# Patient Record
Sex: Female | Born: 1950 | Race: White | Hispanic: No | Smoking: Former smoker
Health system: Southern US, Community
[De-identification: ages and names within clinical notes are randomized; demographics above are authoritative.]

## PROBLEM LIST (undated history)

## (undated) DIAGNOSIS — K5792 Diverticulitis of intestine, part unspecified, without perforation or abscess without bleeding: Secondary | ICD-10-CM

## (undated) DIAGNOSIS — R9230 Dense breasts, unspecified: Secondary | ICD-10-CM

## (undated) DIAGNOSIS — R922 Inconclusive mammogram: Secondary | ICD-10-CM

## (undated) DIAGNOSIS — I10 Essential (primary) hypertension: Secondary | ICD-10-CM

## (undated) DIAGNOSIS — R87619 Unspecified abnormal cytological findings in specimens from cervix uteri: Secondary | ICD-10-CM

## (undated) DIAGNOSIS — C801 Malignant (primary) neoplasm, unspecified: Secondary | ICD-10-CM

## (undated) DIAGNOSIS — A64 Unspecified sexually transmitted disease: Secondary | ICD-10-CM

## (undated) DIAGNOSIS — IMO0002 Reserved for concepts with insufficient information to code with codable children: Secondary | ICD-10-CM

## (undated) HISTORY — DX: Diverticulitis of intestine, part unspecified, without perforation or abscess without bleeding: K57.92

## (undated) HISTORY — DX: Inconclusive mammogram: R92.2

## (undated) HISTORY — DX: Unspecified sexually transmitted disease: A64

## (undated) HISTORY — DX: Reserved for concepts with insufficient information to code with codable children: IMO0002

## (undated) HISTORY — DX: Unspecified abnormal cytological findings in specimens from cervix uteri: R87.619

## (undated) HISTORY — PX: TEMPOROMANDIBULAR JOINT SURGERY: SHX35

## (undated) HISTORY — DX: Malignant (primary) neoplasm, unspecified: C80.1

## (undated) HISTORY — PX: TONSILLECTOMY: SUR1361

## (undated) HISTORY — DX: Dense breasts, unspecified: R92.30

## (undated) HISTORY — PX: CHOLECYSTECTOMY: SHX55

## (undated) HISTORY — PX: APPENDECTOMY: SHX54

---

## 1981-05-04 HISTORY — PX: VAGINAL HYSTERECTOMY: SUR661

## 2009-01-18 ENCOUNTER — Encounter: Admission: RE | Admit: 2009-01-18 | Discharge: 2009-01-18 | Payer: Self-pay | Admitting: Chiropractic Medicine

## 2010-01-02 HISTORY — PX: COLPOSCOPY: SHX161

## 2011-10-23 ENCOUNTER — Other Ambulatory Visit: Payer: Self-pay | Admitting: Sports Medicine

## 2011-10-23 DIAGNOSIS — M542 Cervicalgia: Secondary | ICD-10-CM

## 2011-10-26 ENCOUNTER — Ambulatory Visit
Admission: RE | Admit: 2011-10-26 | Discharge: 2011-10-26 | Disposition: A | Discharge status: Home / Self Care | Payer: BC Managed Care – PPO | Source: Ambulatory Visit | Attending: Sports Medicine | Admitting: Sports Medicine

## 2011-10-26 DIAGNOSIS — M542 Cervicalgia: Secondary | ICD-10-CM

## 2012-03-19 ENCOUNTER — Encounter (HOSPITAL_COMMUNITY): Payer: Self-pay | Admitting: Emergency Medicine

## 2012-03-19 ENCOUNTER — Emergency Department (HOSPITAL_COMMUNITY)
Admission: EM | Admit: 2012-03-19 | Discharge: 2012-03-19 | Disposition: A | Discharge status: Home / Self Care | Payer: BC Managed Care – PPO | Attending: Emergency Medicine | Admitting: Emergency Medicine

## 2012-03-19 DIAGNOSIS — Z79899 Other long term (current) drug therapy: Secondary | ICD-10-CM | POA: Insufficient documentation

## 2012-03-19 DIAGNOSIS — I10 Essential (primary) hypertension: Secondary | ICD-10-CM | POA: Insufficient documentation

## 2012-03-19 DIAGNOSIS — T7840XA Allergy, unspecified, initial encounter: Secondary | ICD-10-CM

## 2012-03-19 DIAGNOSIS — R21 Rash and other nonspecific skin eruption: Secondary | ICD-10-CM | POA: Insufficient documentation

## 2012-03-19 DIAGNOSIS — Y939 Activity, unspecified: Secondary | ICD-10-CM | POA: Insufficient documentation

## 2012-03-19 DIAGNOSIS — Y929 Unspecified place or not applicable: Secondary | ICD-10-CM | POA: Insufficient documentation

## 2012-03-19 HISTORY — DX: Essential (primary) hypertension: I10

## 2012-03-19 LAB — POCT I-STAT TROPONIN I: Troponin i, poc: 0.02 ng/mL (ref 0.00–0.08)

## 2012-03-19 MED ORDER — HYDROXYZINE HCL 10 MG PO TABS: 10.0000 mg | ORAL_TABLET | Freq: Three times a day (TID) | ORAL | Status: DC | PRN

## 2012-03-19 MED ORDER — SODIUM CHLORIDE 0.9 % IV SOLN
Freq: Once | INTRAVENOUS | Status: AC
  Administered 2012-03-19: 16:00:00 via INTRAVENOUS

## 2012-03-19 MED ORDER — EPINEPHRINE 0.3 MG/0.3ML IJ DEVI: 0.3000 mg | INTRAMUSCULAR | Status: AC | PRN

## 2012-03-19 MED ORDER — HYDROXYZINE HCL 10 MG PO TABS
10.0000 mg | ORAL_TABLET | Freq: Once | ORAL | Status: AC
  Administered 2012-03-19: 10 mg via ORAL
  Filled 2012-03-19: qty 1

## 2012-03-19 MED ORDER — METHYLPREDNISOLONE SODIUM SUCC 125 MG IJ SOLR
INTRAMUSCULAR | Status: AC
  Administered 2012-03-19: 125 mg
  Filled 2012-03-19: qty 2

## 2012-03-19 NOTE — ED Notes (Signed)
Pt states she feels much better. Pt given Ginger Ale for PO challenge. Pt awaiting disposition. Family at bedside.

## 2012-03-19 NOTE — ED Notes (Signed)
Pt in via EMS post bee sting. Pt believes that it was a "yellow jacket". Pt denies having a reaction before. Pt reports after sting she had difficulty breathing, emesis and hives all over. Pt took 50 benadryl PO prior to EMS arrival. Ems administered 50 Zantac, 4 Zofran. Pt now c/o upper epigastric pain that radiates to back, 9/10 pain. Pt in NAD and A&Ox4.

## 2012-03-19 NOTE — ED Notes (Signed)
WUJ:[id number]<JY> Expected date:03/19/12<BR> Expected time: 2:13 PM<BR> Means of arrival:Ambulance<BR> Comments:<BR> Bee Sting allergic reaction

## 2012-03-19 NOTE — ED Notes (Signed)
Pt reports she is feeling much better. Pt has raised,reddened rash over L and R arms, abdomen and back. Pt vs stable.

## 2012-03-19 NOTE — ED Notes (Signed)
Pt states itching has improved. Pt resting quietly. IV fluids infusing per order. Pt states she is still a bit nauseous. Pt with no acute distress. VSS. Family at bedside.

## 2012-03-19 NOTE — ED Provider Notes (Signed)
History     CSN: [id number]  Arrival date & time 03/19/12  1419   First MD Initiated Contact with Patient 03/19/12 1455      Chief Complaint  Patient presents with  . Allergic Reaction    (Consider location/radiation/quality/duration/timing/severity/associated sxs/prior treatment) Patient is a 61 y.o. female presenting with allergic reaction. The history is provided by the patient.  Allergic Reaction The primary symptoms are  abdominal pain (epigastric), nausea, rash (urticaria), angioedema and urticaria. The primary symptoms do not include wheezing, shortness of breath, cough, vomiting, diarrhea, dizziness, palpitations or altered mental status. The current episode started less than 1 hour ago. The problem has not changed since onset.This is a new problem.  The rash is associated with itching.   The angioedema began less than 1 hour ago. The angioedema has been unchanged since its onset. It is a new problem. It is located on the tongue. The angioedema is not associated with shortness of breath or stridor.   The onset of the reaction was associated with insect bite/sting. Significant symptoms also include itching. Significant symptoms that are not present include eye redness, flushing or rhinorrhea.    Past Medical History  Diagnosis Date  . Hypertension     Past Surgical History  Procedure Date  . Cholecystectomy   . Abdominal hysterectomy   . Tonsillectomy   . Appendectomy   . Temporomandibular joint surgery     Right    No family history on file.  History  Substance Use Topics  . Smoking status: Never Smoker   . Smokeless tobacco: Not on file  . Alcohol Use: 0.6 oz/week    1 Glasses of wine per week    OB History    Grav Para Term Preterm Abortions TAB SAB Ect Mult Living                  Review of Systems  Constitutional: Negative for fever, chills and appetite change.  HENT: Negative for congestion, sore throat, rhinorrhea, drooling, mouth sores and  trouble swallowing.   Eyes: Negative for redness and visual disturbance.  Respiratory: Negative for cough, shortness of breath, wheezing and stridor.   Cardiovascular: Negative for chest pain, palpitations and leg swelling.  Gastrointestinal: Positive for nausea and abdominal pain (epigastric). Negative for vomiting and diarrhea.  Genitourinary: Negative for dysuria, urgency and frequency.  Musculoskeletal: Positive for back pain.  Skin: Positive for itching and rash (urticaria). Negative for flushing.  Neurological: Negative for dizziness, syncope, weakness, light-headedness, numbness and headaches.  Psychiatric/Behavioral: Negative for confusion and altered mental status.    Allergies  Bee venom  Home Medications   Current Outpatient Rx  Name  Route  Sig  Dispense  Refill  . DIPHENHYDRAMINE HCL (SLEEP) 25 MG PO TABS   Oral   Take 50 mg by mouth once.         . TELMISARTAN 40 MG PO TABS   Oral   Take 40 mg by mouth daily.           BP 126/80  Pulse 54  Temp 97.4 F (36.3 C)  Resp 22  SpO2 100%  Physical Exam  Nursing note and vitals reviewed. Constitutional: She is oriented to person, place, and time. She appears well-developed and well-nourished. No distress.  HENT:  Head: Normocephalic and atraumatic.  Mouth/Throat: Oropharynx is clear and moist and mucous membranes are normal.       No sign of airway obstruction. No edema of face, eyelids, lips, uvula. Mild  tongue swelling. Uvula midline, no nasal congestion or drooling.  No trismus.  Neck: Trachea normal, normal range of motion and full passive range of motion without pain. Neck supple. Carotid bruit is not present. No tracheal deviation present.       No carotid bruits or stridor  Cardiovascular: Normal rate, regular rhythm, intact distal pulses and normal pulses.        Not tachycardic  Pulmonary/Chest: Effort normal. No stridor.  Musculoskeletal: Normal range of motion.  Neurological: She is alert and  oriented to person, place, and time.  Skin: Skin is warm and intact. Rash noted. Rash is urticarial. She is not diaphoretic.       Not diaphoretic. Raised erythematous welts, pruritic in nature, located all extremities & torso. Blanchable urticaria, no petechiae or purpura.   Psychiatric: She has a normal mood and affect. Her behavior is normal.    ED Course  Procedures (including critical care time)  Labs Reviewed - No data to display No results found.   No diagnosis found.  4:29 PM- pt re-evaluated without worsening of symptoms. Reports improved pruritis. Will re- check in 1 hour  5:48 PM No evidence of rebound reaction, pt cont to improve   MDM  Allergic rxn  Patient re-evaluated prior to dc, is hemodynamically stable, in no respiratory distress, and denies the feeling of throat closing. Pt has been advised to take OTC benadryl & return to the ED if they have a mod-severe allergic rxn (s/s including throat closing, difficulty breathing, swelling of lips face or tongue). Pt is to follow up with their PCP. Pt is agreeable with plan & verbalizes understanding.         Jaci Carrel, [place] 03/19/12 1749

## 2012-03-20 NOTE — ED Provider Notes (Signed)
Medical screening examination/treatment/procedure(s) were performed by non-physician practitioner and as supervising physician I was immediately available for consultation/collaboration.  Harriet Sutphen, MD 03/20/12 0000 

## 2012-04-08 ENCOUNTER — Other Ambulatory Visit: Payer: Self-pay | Admitting: Obstetrics & Gynecology

## 2012-04-08 DIAGNOSIS — Z803 Family history of malignant neoplasm of breast: Secondary | ICD-10-CM

## 2012-04-08 DIAGNOSIS — R922 Inconclusive mammogram: Secondary | ICD-10-CM

## 2012-05-03 ENCOUNTER — Other Ambulatory Visit: Payer: BC Managed Care – PPO

## 2013-01-04 ENCOUNTER — Encounter: Payer: Self-pay | Admitting: Certified Nurse Midwife

## 2013-01-09 ENCOUNTER — Ambulatory Visit: Payer: Self-pay | Admitting: Certified Nurse Midwife

## 2013-01-10 ENCOUNTER — Ambulatory Visit: Payer: Self-pay | Admitting: Certified Nurse Midwife

## 2013-01-19 ENCOUNTER — Encounter: Payer: Self-pay | Admitting: Certified Nurse Midwife

## 2013-01-23 ENCOUNTER — Ambulatory Visit (INDEPENDENT_AMBULATORY_CARE_PROVIDER_SITE_OTHER): Payer: BC Managed Care – PPO | Admitting: Certified Nurse Midwife

## 2013-01-23 ENCOUNTER — Encounter: Payer: Self-pay | Admitting: Certified Nurse Midwife

## 2013-01-23 VITALS — BP 112/62 | HR 64 | Resp 16 | Ht 66.25 in | Wt 155.0 lb

## 2013-01-23 DIAGNOSIS — Z9189 Other specified personal risk factors, not elsewhere classified: Secondary | ICD-10-CM

## 2013-01-23 DIAGNOSIS — Z0000 Encounter for general adult medical examination without abnormal findings: Secondary | ICD-10-CM

## 2013-01-23 DIAGNOSIS — Z01419 Encounter for gynecological examination (general) (routine) without abnormal findings: Secondary | ICD-10-CM

## 2013-01-23 NOTE — Progress Notes (Signed)
62 y.o. G0P0000 Single Caucasian Fe here for annual exam. Denies vaginal bleeding or vaginal dryness. Recently treated for anemia with PCP.  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 No health issues today. Sees PCP for aex, labs, medication management for hypertension.. No HSV outbreaks, no Rx needed.  Patient's last menstrual period was 05/04/1981.          Sexually active: yes  The current method of family planning is status post hysterectomy.    Exercising: yes  workout with a trainer Smoker:  no  Health Maintenance: Pap:  01-08-12 neg MMG:  12/13 normal Colonoscopy:  2010 BMD:   2010 TDaP:  2010 Labs: Hgb. 13.2 Self breast exam: done occ   reports that she has quit smoking. She does not have any smokeless tobacco history on file. She reports that she drinks about 4.8 ounces of alcohol per week. She reports that she does not use illicit drugs.  Past Medical History  Diagnosis Date  . Hypertension   . STD (sexually transmitted disease)     HSV2  . Abnormal Pap smear     sever dysplasia  . Dense breast     BSGI     Past Surgical History  Procedure Laterality Date  . Cholecystectomy    . Tonsillectomy    . Appendectomy    . Temporomandibular joint surgery      Right  . Colposcopy  9/11    atypia only  . Vaginal hysterectomy  1983    Current Outpatient Prescriptions  Medication Sig Dispense Refill  . EPINEPHrine (EPIPEN) 0.3 mg/0.3 mL DEVI Inject 0.3 mLs (0.3 mg total) into the muscle as needed.  1 Device  3  . Multiple Vitamins-Minerals (MULTIVITAMIN PO) Take by mouth. occ      . telmisartan (MICARDIS) 40 MG tablet Take 40 mg by mouth daily.      . valACYclovir (VALTREX) 1000 MG tablet Take 1,000 mg by mouth. Take 1 tablet twice a day for 3 days at onset of outbreak       No current facility-administered medications for this visit.    Family History  Problem Relation Age of Onset  . Hypertension Mother   . Lung disease Mother   . Heart  disease Mother   . Breast cancer Mother   . Osteoporosis Mother   . Hypertension Father   . Heart disease Father   . Hypertension Brother   . Cancer Brother   . Breast cancer Maternal Aunt   . Hypertension Brother     ROS:  Pertinent items are noted in HPI.  Otherwise, a comprehensive ROS was negative.  Exam:   BP 112/62  Pulse 64  Resp 16  Ht 5' 6.25" (1.683 m)  Wt 155 lb (70.308 kg)  BMI 24.82 kg/m2  LMP 05/04/1981 Height: 5' 6.25" (168.3 cm)  Ht Readings from Last 3 Encounters:  01/23/13 5' 6.25" (1.683 m)    General appearance: alert, cooperative and appears stated age Head: Normocephalic, without obvious abnormality, atraumatic Neck: no adenopathy, supple, symmetrical, trachea midline and thyroid normal to inspection and palpation Lungs: clear to auscultation bilaterally Breasts: normal appearance, no masses or tenderness, No nipple retraction or dimpling, No nipple discharge or bleeding, No axillary or supraclavicular adenopathy Heart: regular rate and rhythm Abdomen: soft, non-tender; no masses,  no organomegaly Extremities: extremities normal, atraumatic, no cyanosis or edema Skin: Skin color, texture, turgor normal. No rashes or lesions Lymph nodes: Cervical, supraclavicular, and  axillary nodes normal. No abnormal inguinal nodes palpated Neurologic: Grossly normal   Pelvic: External genitalia:  no lesions              Urethra:  normal appearing urethra with no masses, tenderness or lesions              Bartholin's and Skene's: normal                 Vagina: normal appearing vagina with normal color and discharge, no lesions              Cervix: absent              Pap taken: yes Bimanual Exam:  Uterus:  uterus absent              Adnexa: normal adnexa and no mass, fullness, tenderness               Rectovaginal: Confirms               Anus:  normal sphincter tone, no lesions  A:  Well Woman with normal exam  Menopausal no HRT S/P TVH, 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 Will ? Do MRI this year(insurance change)  DES exposure in utero  Hypertension with PCP management  Anemia resolved   P:   Reviewed health and wellness pertinent to exam  Stressed aex for evaluation and breast exam  Continue PCP follow up as indicated  Pap smear as per guidelines   Mammogram yearly with MRI this year if financially possible pap smear taken with HPVHR  counseled on breast self exam, mammography screening, adequate intake of calcium and vitamin D, diet and exercise, Kegel's exercises  return annually or prn  An After Visit Summary was printed and given to the patient.

## 2013-01-23 NOTE — Patient Instructions (Signed)

## 2013-01-24 NOTE — Progress Notes (Signed)
Note reviewed, agree with plan.  Anvika Gashi, MD  

## 2013-12-15 ENCOUNTER — Telehealth: Payer: Self-pay | Admitting: Certified Nurse Midwife

## 2013-12-15 ENCOUNTER — Other Ambulatory Visit: Payer: Self-pay | Admitting: Certified Nurse Midwife

## 2013-12-15 MED ORDER — VALACYCLOVIR HCL 1 G PO TABS: ORAL_TABLET | ORAL | Status: DC

## 2013-12-15 NOTE — Telephone Encounter (Signed)
Pt wanting a refill on her valacyclovir 1mg .

## 2013-12-15 NOTE — Telephone Encounter (Signed)
Ok to refill 

## 2013-12-15 NOTE — Telephone Encounter (Signed)
Rx sent to pharmacy. - LM for pt re: Rx sent to pharmacy.

## 2013-12-15 NOTE — Telephone Encounter (Signed)
Last AEX 01/23/13 Next appt 01/24/14  Please advise.

## 2014-01-24 ENCOUNTER — Encounter: Payer: Self-pay | Admitting: Certified Nurse Midwife

## 2014-01-24 ENCOUNTER — Ambulatory Visit (INDEPENDENT_AMBULATORY_CARE_PROVIDER_SITE_OTHER): Payer: BC Managed Care – PPO | Admitting: Certified Nurse Midwife

## 2014-01-24 VITALS — BP 120/80 | HR 72 | Resp 16 | Ht 66.5 in | Wt 159.0 lb

## 2014-01-24 DIAGNOSIS — Z01419 Encounter for gynecological examination (general) (routine) without abnormal findings: Secondary | ICD-10-CM

## 2014-01-24 NOTE — Progress Notes (Signed)
63 y.o. G0P0000 Single Caucasian Fe here for annual exam. Menopausal no HRT. Denies vaginal bleeding. Treating with vaginal dryness Olive Oil with good results.. Recent visit with PCP with labs and aex, all normal. Exercising routinely. 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 Planning marriage sometime in the next year. No HSV outbreaks in past year. Patient planning to try for MRI after next mammogram, could not afford in past with minimal insurance coverage. No other health issues today.  Patient's last menstrual period was 05/04/1981.          Sexually active: Yes.    The current method of family planning is status post hysterectomy.    Exercising: Yes.    workout with a trainer twice weekly  Smoker:  no  Health Maintenance: Pap: 01-23-13 neg HPV HR neg MMG: 05-03-13 composition category d, bi-rads category 1:neg Colonoscopy: 01-23-14 negative BMD:   2010 TDaP:  2010 Labs: pcp Self breast exam: done occ   reports that she has quit smoking. She does not have any smokeless tobacco history on file. She reports that she drinks about 4.8 - 7.2 ounces of alcohol per week. She reports that she does not use illicit drugs.  Past Medical History  Diagnosis Date  . Hypertension   . STD (sexually transmitted disease)     HSV2  . Abnormal Pap smear     sever dysplasia  . Dense breast     BSGI     Past Surgical History  Procedure Laterality Date  . Cholecystectomy    . Tonsillectomy    . Appendectomy    . Temporomandibular joint surgery      Right  . Colposcopy  9/11    atypia only  . Vaginal hysterectomy  1983    Current Outpatient Prescriptions  Medication Sig Dispense Refill  . Cholecalciferol (VITAMIN D PO) Take by mouth daily.      Marland Kitchen EPINEPHrine (EPIPEN) 0.3 mg/0.3 mL DEVI Inject 0.3 mLs (0.3 mg total) into the muscle as needed.  1 Device  3  . Multiple Vitamins-Minerals (MULTIVITAMIN PO) Take by mouth. occ      . Probiotic Product (ALIGN PO) Take by mouth daily.      Marland Kitchen  telmisartan (MICARDIS) 40 MG tablet Take 40 mg by mouth daily.      . valACYclovir (VALTREX) 1000 MG tablet Take 1 tablet twice a day for 3 days at onset of outbreak  30 tablet  1   No current facility-administered medications for this visit.    Family History  Problem Relation Age of Onset  . Hypertension Mother   . Lung disease Mother   . Heart disease Mother   . Breast cancer Mother   . Osteoporosis Mother   . Hypertension Father   . Heart disease Father   . Hypertension Brother   . Cancer Brother     esophageal  . Breast cancer Maternal Aunt   . Hypertension Brother     ROS:  Pertinent items are noted in HPI.  Otherwise, a comprehensive ROS was negative.  Exam:   BP 120/80  Pulse 72  Resp 16  Ht 5' 6.5" (1.689 m)  Wt 159 lb (72.122 kg)  BMI 25.28 kg/m2  LMP 05/04/1981 Height: 5' 6.5" (168.9 cm)  Ht Readings from Last 3 Encounters:  01/24/14 5' 6.5" (1.689 m)  01/23/13 5' 6.25" (1.683 m)    General appearance: alert, cooperative and appears stated age Head: Normocephalic, without obvious abnormality, atraumatic Neck: no adenopathy,  supple, symmetrical, trachea midline and thyroid normal to inspection and palpation Lungs: clear to auscultation bilaterally Breasts: normal appearance, no masses or tenderness, No nipple retraction or dimpling, No nipple discharge or bleeding, No axillary or supraclavicular adenopathy Heart: regular rate and rhythm Abdomen: soft, non-tender; no masses,  no organomegaly Extremities: extremities normal, atraumatic, no cyanosis or edema Skin: Skin color, texture, turgor normal. No rashes or lesions Lymph nodes: Cervical, supraclavicular, and axillary nodes normal. No abnormal inguinal nodes palpated Neurologic: Grossly normal   Pelvic: External genitalia:  no lesions              Urethra:  normal appearing urethra with no masses, tenderness or lesions              Bartholin's and Skene's: normal                 Vagina: normal  appearing vagina with normal color and discharge, no lesions              Cervix: absent              Pap taken: No. Bimanual Exam:  Uterus:  uterus absent              Adnexa: normal adnexa and no mass, fullness, tenderness               Rectovaginal: Confirms               Anus:  normal sphincter tone, no lesions  A:  Well Woman with normal exam  Menopausal no HRT S/P TAH for severe dysplasia 1983  LSIL with atypia only in 2011, all paps negative  Breast class D density, Family history of breast cancer, mother and maternal aunt  HSV history has RX  P:   Reviewed health and wellness pertinent to exam  Discussed pap frequency and the time of routine paps after surgery as 20 years and vaginal pap change with normal paps and negative HPVHR and recommendation.  Pap smear  not taken today will need next year.  Discussed Class D density and need for 3 D mammogram and as she is aware and plans to do MRI. Patient aware MRI has to be done within 3 months of mammogram. Patient to have mammogram 12/15 and will called when completed for MRI to be scheduled if mammogram normal.   counseled on breast self exam, mammography screening, adequate intake of calcium and vitamin D, diet and exercise, Kegel's exercises  return annually or prn  An After Visit Summary was printed and given to the patient.

## 2014-01-24 NOTE — Patient Instructions (Signed)

## 2014-01-25 NOTE — Progress Notes (Signed)
Reviewed personally.  M. Suzanne Carly Applegate, MD.  

## 2015-01-30 ENCOUNTER — Encounter: Payer: Self-pay | Admitting: Certified Nurse Midwife

## 2015-01-30 ENCOUNTER — Ambulatory Visit (INDEPENDENT_AMBULATORY_CARE_PROVIDER_SITE_OTHER): Payer: BLUE CROSS/BLUE SHIELD | Admitting: Certified Nurse Midwife

## 2015-01-30 VITALS — BP 118/74 | HR 70 | Resp 16 | Ht 66.5 in | Wt 154.0 lb

## 2015-01-30 DIAGNOSIS — Z01419 Encounter for gynecological examination (general) (routine) without abnormal findings: Secondary | ICD-10-CM

## 2015-01-30 DIAGNOSIS — Z124 Encounter for screening for malignant neoplasm of cervix: Secondary | ICD-10-CM | POA: Diagnosis not present

## 2015-01-30 DIAGNOSIS — N951 Menopausal and female climacteric states: Secondary | ICD-10-CM | POA: Diagnosis not present

## 2015-01-30 DIAGNOSIS — K649 Unspecified hemorrhoids: Secondary | ICD-10-CM

## 2015-01-30 NOTE — Progress Notes (Signed)
Encounter reviewed Jill Jertson, MD   

## 2015-01-30 NOTE — Patient Instructions (Signed)
EXERCISE AND DIET:  We recommended that you start or continue a regular exercise program for good health. Regular exercise means any activity that makes your heart beat faster and makes you sweat.  We recommend exercising at least 30 minutes per day at least 3 days a week, preferably 4 or 5.  We also recommend a diet low in fat and sugar.  Inactivity, poor dietary choices and obesity can cause diabetes, heart attack, stroke, and kidney damage, among others.    ALCOHOL AND SMOKING:  Women should limit their alcohol intake to no more than 7 drinks/beers/glasses of wine (combined, not each!) per week. Moderation of alcohol intake to this level decreases your risk of breast cancer and liver damage. And of course, no recreational drugs are part of a healthy lifestyle.  And absolutely no smoking or even second hand smoke. Most people know smoking can cause heart and lung diseases, but did you know it also contributes to weakening of your bones? Aging of your skin?  Yellowing of your teeth and nails?  CALCIUM AND VITAMIN D:  Adequate intake of calcium and Vitamin D are recommended.  The recommendations for exact amounts of these supplements seem to change often, but generally speaking 600 mg of calcium (either carbonate or citrate) and 800 units of Vitamin D per day seems prudent. Certain women may benefit from higher intake of Vitamin D.  If you are among these women, your doctor will have told you during your visit.    PAP SMEARS:  Pap smears, to check for cervical cancer or precancers,  have traditionally been done yearly, although recent scientific advances have shown that most women can have pap smears less often.  However, every woman still should have a physical exam from her gynecologist every year. It will include a breast check, inspection of the vulva and vagina to check for abnormal growths or skin changes, a visual exam of the cervix, and then an exam to evaluate the size and shape of the uterus and  ovaries.  And after 64 years of age, a rectal exam is indicated to check for rectal cancers. We will also provide age appropriate advice regarding health maintenance, like when you should have certain vaccines, screening for sexually transmitted diseases, bone density testing, colonoscopy, mammograms, etc.   MAMMOGRAMS:  All women over 40 years old should have a yearly mammogram. Many facilities now offer a "3D" mammogram, which may cost around $50 extra out of pocket. If possible,  we recommend you accept the option to have the 3D mammogram performed.  It both reduces the number of women who will be called back for extra views which then turn out to be normal, and it is better than the routine mammogram at detecting truly abnormal areas.    COLONOSCOPY:  Colonoscopy to screen for colon cancer is recommended for all women at age 50.  We know, you hate the idea of the prep.  We agree, BUT, having colon cancer and not knowing it is worse!!  Colon cancer so often starts as a polyp that can be seen and removed at colonscopy, which can quite literally save your life!  And if your first colonoscopy is normal and you have no family history of colon cancer, most women don't have to have it again for 10 years.  Once every ten years, you can do something that may end up saving your life, right?  We will be happy to help you get it scheduled when you are ready.    Be sure to check your insurance coverage so you understand how much it will cost.  It may be covered as a preventative service at no cost, but you should check your particular policy.     Hemorrhoids Hemorrhoids are swollen veins around the rectum or anus. There are two types of hemorrhoids:   Internal hemorrhoids. These occur in the veins just inside the rectum. They may poke through to the outside and become irritated and painful.  External hemorrhoids. These occur in the veins outside the anus and can be felt as a painful swelling or hard lump near the  anus. CAUSES  Pregnancy.   Obesity.   Constipation or diarrhea.   Straining to have a bowel movement.   Sitting for long periods on the toilet.  Heavy lifting or other activity that caused you to strain.  Anal intercourse. SYMPTOMS   Pain.   Anal itching or irritation.   Rectal bleeding.   Fecal leakage.   Anal swelling.   One or more lumps around the anus.  DIAGNOSIS  Your caregiver may be able to diagnose hemorrhoids by visual examination. Other examinations or tests that may be performed include:   Examination of the rectal area with a gloved hand (digital rectal exam).   Examination of anal canal using a small tube (scope).   A blood test if you have lost a significant amount of blood.  A test to look inside the colon (sigmoidoscopy or colonoscopy). TREATMENT Most hemorrhoids can be treated at home. However, if symptoms do not seem to be getting better or if you have a lot of rectal bleeding, your caregiver may perform a procedure to help make the hemorrhoids get smaller or remove them completely. Possible treatments include:   Placing a rubber band at the base of the hemorrhoid to cut off the circulation (rubber band ligation).   Injecting a chemical to shrink the hemorrhoid (sclerotherapy).   Using a tool to burn the hemorrhoid (infrared light therapy).   Surgically removing the hemorrhoid (hemorrhoidectomy).   Stapling the hemorrhoid to block blood flow to the tissue (hemorrhoid stapling).  HOME CARE INSTRUCTIONS   Eat foods with fiber, such as whole grains, beans, nuts, fruits, and vegetables. Ask your doctor about taking products with added fiber in them (fibersupplements).  Increase fluid intake. Drink enough water and fluids to keep your urine clear or pale yellow.   Exercise regularly.   Go to the bathroom when you have the urge to have a bowel movement. Do not wait.   Avoid straining to have bowel movements.   Keep the  anal area dry and clean. Use wet toilet paper or moist towelettes after a bowel movement.   Medicated creams and suppositories may be used or applied as directed.   Only take over-the-counter or prescription medicines as directed by your caregiver.   Take warm sitz baths for 15-20 minutes, 3-4 times a day to ease pain and discomfort.   Place ice packs on the hemorrhoids if they are tender and swollen. Using ice packs between sitz baths may be helpful.   Put ice in a plastic bag.   Place a towel between your skin and the bag.   Leave the ice on for 15-20 minutes, 3-4 times a day.   Do not use a donut-shaped pillow or sit on the toilet for long periods. This increases blood pooling and pain.  SEEK MEDICAL CARE IF:  You have increasing pain and swelling that is not controlled by treatment  or medicine.  You have uncontrolled bleeding.  You have difficulty or you are unable to have a bowel movement.  You have pain or inflammation outside the area of the hemorrhoids. MAKE SURE YOU:  Understand these instructions.  Will watch your condition.  Will get help right away if you are not doing well or get worse. Document Released: 04/17/2000 Document Revised: 04/06/2012 Document Reviewed: 02/23/2012 [facility] Patient Information 2015 [place], [place]. This information is not intended to replace advice given to you by your health care provider. Make sure you discuss any questions you have with your health care provider.

## 2015-01-30 NOTE — Progress Notes (Signed)
64 y.o. G0P0000 Single  Caucasian Fe here for annual exam. Menopausal no vaginal bleeding or vaginal dryness. Finishing medication for diverticulitis flare, which was extreme this time. Sees Dr Minna Antis, but now Dr Shelia Media. Plans to see GI if occurs again. Sees PCP for aex/labs for medication management. Not sexually active now and has separated from partner. No STD concerns or screening needed. Seeing therapist to help with separation.( partner younger). Feels like she is doing well. No other health issues today.  Patient's last menstrual period was 05/04/1981.          Sexually active: No.  The current method of family planning is status post hysterectomy.    Exercising: Yes.    weights & cardio Smoker:  no  Health Maintenance: Pap: 01-23-13 neg HPV HR neg MMG: 05-11-14 category d density,birads 2:neg Colonoscopy:  2015 neg BMD:   2010 TDaP:  2010 Labs: pcp Self breast exam: rare   reports that she has quit smoking. She does not have any smokeless tobacco history on file. She reports that she drinks about 3.0 - 4.2 oz of alcohol per week. She reports that she does not use illicit drugs.  Past Medical History  Diagnosis Date  . Hypertension   . STD (sexually transmitted disease)     HSV2  . Abnormal Pap smear     sever dysplasia  . Dense breast     BSGI   . Diverticulitis     Past Surgical History  Procedure Laterality Date  . Cholecystectomy    . Tonsillectomy    . Appendectomy    . Temporomandibular joint surgery      Right  . Colposcopy  9/11    atypia only  . Vaginal hysterectomy  1983    Current Outpatient Prescriptions  Medication Sig Dispense Refill  . Cholecalciferol (VITAMIN D PO) Take by mouth daily.    . ciprofloxacin (CIPRO) 500 MG tablet   0  . metroNIDAZOLE (FLAGYL) 500 MG tablet   0  . telmisartan (MICARDIS) 40 MG tablet Take 40 mg by mouth daily.    . valACYclovir (VALTREX) 1000 MG tablet Take 1 tablet twice a day for 3 days at onset of outbreak 30 tablet 1   . EPINEPHrine (EPIPEN) 0.3 mg/0.3 mL DEVI Inject 0.3 mLs (0.3 mg total) into the muscle as needed. (Patient not taking: Reported on 01/30/2015) 1 Device 3   No current facility-administered medications for this visit.    Family History  Problem Relation Age of Onset  . Hypertension Mother   . Lung disease Mother   . Heart disease Mother   . Breast cancer Mother   . Osteoporosis Mother   . Hypertension Father   . Heart disease Father   . Hypertension Brother   . Cancer Brother     esophageal  . Breast cancer Maternal Aunt   . Hypertension Brother     ROS:  Pertinent items are noted in HPI.  Otherwise, a comprehensive ROS was negative.  Exam:   BP 118/74 mmHg  Pulse 70  Resp 16  Ht 5' 6.5" (1.689 m)  Wt 154 lb (69.854 kg)  BMI 24.49 kg/m2  LMP 05/04/1981 Height: 5' 6.5" (168.9 cm) Ht Readings from Last 3 Encounters:  01/30/15 5' 6.5" (1.689 m)  01/24/14 5' 6.5" (1.689 m)  01/23/13 5' 6.25" (1.683 m)    General appearance: alert, cooperative and appears stated age Head: Normocephalic, without obvious abnormality, atraumatic Neck: no adenopathy, supple, symmetrical, trachea midline and thyroid normal  to inspection and palpation Lungs: clear to auscultation bilaterally Breasts: normal appearance, no masses or tenderness, No nipple retraction or dimpling, No nipple discharge or bleeding, No axillary or supraclavicular adenopathy Heart: regular rate and rhythm Abdomen: soft, non-tender; no masses,  no organomegaly Extremities: extremities normal, atraumatic, no cyanosis or edema Skin: Skin color, texture, turgor normal. No rashes or lesions Lymph nodes: Cervical, supraclavicular, and axillary nodes normal. No abnormal inguinal nodes palpated Neurologic: Grossly normal   Pelvic: External genitalia:  no lesions              Urethra:  normal appearing urethra with no masses, tenderness or lesions              Bartholin's and Skene's: normal                 Vagina: normal  appearing vagina with normal color and discharge, no lesions              Cervix: absent              Pap taken: Yes.   Bimanual Exam:  Uterus:  uterus absent              Adnexa: normal adnexa and no mass, fullness, tenderness               Rectovaginal: Confirms               Anus:  normal sphincter tone, no lesions  Chaperone present: yes   A:  Well Woman with normal exam  Menopausal no HRT S/P TVH for cervical atypia, ovaries retained  Vaginal dryness  Hemorrhoids  Diverticulitis recent flare with PCP management    P:   Reviewed health and wellness pertinent to exam  Aware of need to evaluate if vaginal bleeding.  Discussed vaginal dryness noted on exam. Discussed options for treatment, will only use OTC. Discussed Coconut oil use daily and if sexual active. Instructions given. Will advise if problems.  Discussed dietary management and increase in water to keep stools regular. Discussed Balmex cream external to protect anal tissue as needed. Tucks wipes for comfort with cleaning. Warning signs given and will advise if occurs.  Continue follow up with PCP or GI as needed.  Pap smear as above taken today   counseled on breast self exam, mammography screening, STD prevention, HIV risk factors and prevention, adequate intake of calcium and vitamin D, diet and exercise. BMD due patient will call to schedule.  return annually or prn  An After Visit Summary was printed and given to the patient.

## 2015-01-31 LAB — IPS PAP TEST WITH REFLEX TO HPV

## 2015-04-02 ENCOUNTER — Other Ambulatory Visit: Payer: Self-pay | Admitting: Certified Nurse Midwife

## 2015-04-02 NOTE — Telephone Encounter (Signed)
Medication refill request: Valtrex 1gm tab Last AEX: 01/30/2015 DL Next AEX: 01/31/2015 DL Refill authorized: 12/15/2013 Valtrex 1gm #30 tabs 1 refill  Today: #30 tabs 0 refills ?

## 2015-04-03 ENCOUNTER — Telehealth: Payer: Self-pay | Admitting: Certified Nurse Midwife

## 2015-04-03 NOTE — Telephone Encounter (Signed)
She may take Acyclovir 400 mg BID for 5 days prn flare.

## 2015-04-03 NOTE — Telephone Encounter (Signed)
Patient is requesting alternative medication to Valacyclovir 1000 mg take 1 tablet BID for 3 days with onset of symptoms as her copay is costly for this prescription.

## 2015-04-03 NOTE — Telephone Encounter (Signed)
Patient is asking to change her prescription valACYclovir (VALTREX) 1000 MG tablet due to insurance copay.   [place]

## 2015-04-04 MED ORDER — ACYCLOVIR 400 MG PO TABS: ORAL_TABLET | ORAL | Status: DC

## 2015-04-04 MED ORDER — VALACYCLOVIR HCL 1 G PO TABS: ORAL_TABLET | ORAL | Status: DC

## 2015-04-04 NOTE — Telephone Encounter (Signed)
Spoke with patient. Patient states she believes her prescription needs to be sent to [facility] due to her insurance plan. Requesting that rx for Valacyclovir be sent to the [facility] off [address] in [place]. Rx for Valacyclovir 1000 mg take 1 tablet my mouth twice daily for 3 days at onset of outbreak #30 [id number] sent to [facility] off [address] per patient request. Patient is agreeable.  Routing to provider for final review. Patient agreeable to disposition. Will close encounter.

## 2015-05-15 ENCOUNTER — Telehealth: Payer: Self-pay | Admitting: Emergency Medicine

## 2015-05-15 NOTE — Telephone Encounter (Signed)
Late entry for 1230: Call from [place] at [facility]. She states that patient is there and ready to have screening mammogram completed. She states that when she was about to have mammogram completed, she complained of R nipple pain (pain behind the nipple) for one month. They are calling for orders. Melvia Heaps CNM not available. Verbal orders and signed orders obtained from [place], [place] for bilateral diagnostic mammogram with R Breast ultrasound.  Mammogram hold placed.  Routing message to Melvia Heaps CNM for review.

## 2015-05-28 ENCOUNTER — Telehealth: Payer: Self-pay | Admitting: Emergency Medicine

## 2015-05-28 DIAGNOSIS — R922 Inconclusive mammogram: Secondary | ICD-10-CM

## 2015-05-28 DIAGNOSIS — Z1239 Encounter for other screening for malignant neoplasm of breast: Secondary | ICD-10-CM

## 2015-05-28 DIAGNOSIS — N6002 Solitary cyst of left breast: Secondary | ICD-10-CM

## 2015-05-28 DIAGNOSIS — R9230 Dense breasts, unspecified: Secondary | ICD-10-CM

## 2015-05-28 DIAGNOSIS — Z803 Family history of malignant neoplasm of breast: Secondary | ICD-10-CM

## 2015-05-28 NOTE — Telephone Encounter (Signed)
Call to patient with results from [facility] breast imaging dated 05/15/2015.  Recommendations for breast MRI due to Category D Breast Density and strong family history.   Patient is out of town right now and will call back to discuss when she is back in town.

## 2015-06-03 NOTE — Telephone Encounter (Signed)
Patient returning call.

## 2015-06-04 NOTE — Telephone Encounter (Signed)
Spoke with patient. She is advised of reccomendation for MRI on screening mammogram at [facility] on 05/15/15 due to family history and dense breasts.  Patient made aware of potential possible false positives of MRI which could lead to MRI guided breast biopsy and patient understands. She will like to move forward with planning MRI and will discuss cost with [facility]. Ordered and sent to [facility].

## 2015-06-06 NOTE — Telephone Encounter (Signed)
Authorization obtained by Dr. Sabra Heck.  Patient is scheduled for MRI Bilateral Breast At [place] 06/12/15.  Will close encounter.

## 2015-06-11 ENCOUNTER — Telehealth: Payer: Self-pay | Admitting: Certified Nurse Midwife

## 2015-06-11 NOTE — Telephone Encounter (Signed)
Spoke with patient. Patient states that she is scheduled to have a breast MRI tomorrow at [place]. She has a strong family history of breast cancer and dense breast. Last mammogram was performed on 05/15/2015. She will have to pay $730 OOP for the imaging. Her insurance will change to Medicare in May 2017. She is requesting to have imaging performed at that time. "I want to do what is best for my health, but it would be nice to save that money. I know I have to have a mammogram within a certain amount of time to have a MRI so I could have another mammogram in May and then the MRI." Advised I will speak with Melvia Heaps CNM and return call with further recommendations. She is agreeable.

## 2015-06-11 NOTE — Telephone Encounter (Signed)
Patient has a Breast MRI scheduled tomorrow and would like to reschedule. . Patient would like to wait until after 09/17/2015 to have this Breast MRI that we scheduled for her. Patient would like to have a MMG done in the meantime. Patient is asking for an order for this MMG.

## 2015-06-11 NOTE — Telephone Encounter (Signed)
I don't think they will pay for another mammogram and we would have no clinical reason to schedule.  So I don't know if this is possible. I understand the cost. We could call and see if with MRI could be still be done at 4 months out not 3.

## 2015-06-12 ENCOUNTER — Other Ambulatory Visit: Payer: Self-pay

## 2015-06-12 NOTE — Telephone Encounter (Signed)
Patient has cancelled MRI.  Continued hold.

## 2015-06-12 NOTE — Telephone Encounter (Signed)
Spoke with Kennyth Lose at [facility] who states they are unable to verify if another mammogram will be covered when the patient starts her new policy with Medicare. Kennyth Lose suggests that the patient contact her insurance when it changes in May to see if this will be covered. MRI has to be performed within three months of the last mammogram. Spoke with patient. Advised of message received from [place]. She verbalizes understanding and will contact Medicare in May when her new policy starts to discuss coverage. She states "I could pay for the mammogram if I need to and then have the MRI through Medicare." Advised I will let Melvia Heaps CNM know.  Melvia Heaps CNM would you like me to place the patient in hold?

## 2015-06-12 NOTE — Telephone Encounter (Signed)
Patient is calling to get an update about her request to reschedule MRI for May.

## 2015-06-12 NOTE — Telephone Encounter (Signed)
yes

## 2015-11-19 ENCOUNTER — Telehealth: Payer: Self-pay | Admitting: Emergency Medicine

## 2015-11-19 DIAGNOSIS — R9230 Dense breasts, unspecified: Secondary | ICD-10-CM

## 2015-11-19 DIAGNOSIS — Z1239 Encounter for other screening for malignant neoplasm of breast: Secondary | ICD-10-CM

## 2015-11-19 DIAGNOSIS — R922 Inconclusive mammogram: Secondary | ICD-10-CM

## 2015-11-19 DIAGNOSIS — Z803 Family history of malignant neoplasm of breast: Secondary | ICD-10-CM

## 2015-11-19 NOTE — Telephone Encounter (Signed)
Call to patient to follow up for Breast MRI planning. She wanted to wait until she had Medicare Coverage for Breast MRI planning.  Will need to have another screening MRI as is required 3 months prior to MRI. Last screening 05/2015. Call to [facility] Director) at [place] and she will discuss plan with radiologist and call patient to schedule.

## 2015-12-04 ENCOUNTER — Telehealth: Payer: Self-pay

## 2015-12-04 NOTE — Telephone Encounter (Signed)
Called patient to give bmd results. Pt notified. See scan

## 2015-12-09 NOTE — Telephone Encounter (Signed)
Patient wants to talk with the nurse about scheduling an MRI.

## 2015-12-12 ENCOUNTER — Encounter: Payer: Self-pay | Admitting: Certified Nurse Midwife

## 2015-12-13 ENCOUNTER — Encounter: Payer: Self-pay | Admitting: *Deleted

## 2015-12-13 NOTE — Telephone Encounter (Signed)
Patient is waiting on a call to schedule her MRI.

## 2015-12-13 NOTE — Telephone Encounter (Signed)
Called patient to let her know that the Triage Nurse who typically handles breast MRI's is out of the office today. Stated to patient should would be back on Monday and would follow up with provider and [facility] next week. Patient agreeable and appreciative of call.   Routing to Melvia Heaps, CNM for review.

## 2015-12-16 NOTE — Telephone Encounter (Signed)
Patient calling to let us know she has faxed the new insurance information to our office.

## 2015-12-16 NOTE — Telephone Encounter (Signed)
Spoke with patient. Patient reports she now has Medicare coverage and would like to schedule her breast MRI. This was recommended in January, but patient declined until new insurance coverage began. Patient will fax new insurance information to the office for precertification and scheduling. She will return call when she as faxed new insurance information so we can begin the process.

## 2015-12-17 NOTE — Telephone Encounter (Signed)
Patient said she was told to call when she had faxed her new insurance information. Patient said she faxed faxed this information yesterday.

## 2015-12-17 NOTE — Telephone Encounter (Signed)
New order for bilateral breast MRI with and without contrast placed for patient. This was recommended in January 2017 following patient's screening mammogram. Patient declined at that time as she wanted to wait for medicare coverage. Please precert so this may be scheduled at this time.

## 2015-12-18 NOTE — Telephone Encounter (Signed)
Patient was contacted by [place] [facility] and scheduled for 12/30/2015 at 10:40 am at the [place] location. Will keep in mammogram hold.  Cc: Dr.Miller  Routing to provider for final review. Patient agreeable to disposition. Will close encounter.

## 2015-12-18 NOTE — Telephone Encounter (Signed)
Precert not required by either insurance. Patient aware. Call to [place]. They will contact patient to schedule.  Referral deferred to follow up on appointment scheduling. Route to nurse for review prior to closing.

## 2015-12-27 ENCOUNTER — Other Ambulatory Visit: Payer: Self-pay

## 2015-12-30 ENCOUNTER — Other Ambulatory Visit: Payer: Self-pay

## 2015-12-30 ENCOUNTER — Ambulatory Visit
Admission: RE | Admit: 2015-12-30 | Discharge: 2015-12-30 | Disposition: A | Discharge status: Home / Self Care | Payer: Medicare Other | Source: Ambulatory Visit | Attending: Obstetrics & Gynecology | Admitting: Obstetrics & Gynecology

## 2015-12-30 DIAGNOSIS — Z803 Family history of malignant neoplasm of breast: Secondary | ICD-10-CM

## 2015-12-30 DIAGNOSIS — R922 Inconclusive mammogram: Secondary | ICD-10-CM

## 2015-12-30 DIAGNOSIS — Z1239 Encounter for other screening for malignant neoplasm of breast: Secondary | ICD-10-CM

## 2015-12-30 MED ORDER — GADOBENATE DIMEGLUMINE 529 MG/ML IV SOLN
13.0000 mL | Freq: Once | INTRAVENOUS | Status: AC | PRN
  Administered 2015-12-30: 13 mL via INTRAVENOUS

## 2015-12-31 ENCOUNTER — Telehealth: Payer: Self-pay | Admitting: Certified Nurse Midwife

## 2015-12-31 NOTE — Telephone Encounter (Signed)
Patient notified of results as seen below by Dr. Sabra Heck. Patient verbalized understanding. Patient states she is "appreciative of call and Ms. Debbi staying on top of her to get this done."  Routing to provider for final review. Patient agreeable to disposition. Will close encounter.   Cc Melvia Heaps, CNM

## 2015-12-31 NOTE — Telephone Encounter (Signed)
Patient called for her results for an MRI of the breast done at [facility] yesterday.

## 2015-12-31 NOTE — Telephone Encounter (Signed)
-----   Message from Megan Salon, MD sent at 12/31/2015  8:45 AM EDT ----- Please inform pt MRI of breasts was negative for abnormal findings.

## 2016-01-31 ENCOUNTER — Encounter: Payer: Self-pay | Admitting: Certified Nurse Midwife

## 2016-01-31 ENCOUNTER — Ambulatory Visit (INDEPENDENT_AMBULATORY_CARE_PROVIDER_SITE_OTHER): Payer: Medicare Other | Admitting: Certified Nurse Midwife

## 2016-01-31 VITALS — BP 102/70 | HR 70 | Resp 16 | Ht 66.0 in | Wt 147.0 lb

## 2016-01-31 DIAGNOSIS — A609 Anogenital herpesviral infection, unspecified: Secondary | ICD-10-CM | POA: Diagnosis not present

## 2016-01-31 DIAGNOSIS — Z124 Encounter for screening for malignant neoplasm of cervix: Secondary | ICD-10-CM

## 2016-01-31 DIAGNOSIS — A6009 Herpesviral infection of other urogenital tract: Secondary | ICD-10-CM

## 2016-01-31 DIAGNOSIS — Z01419 Encounter for gynecological examination (general) (routine) without abnormal findings: Secondary | ICD-10-CM

## 2016-01-31 MED ORDER — VALACYCLOVIR HCL 1 G PO TABS: ORAL_TABLET | ORAL | 6 refills | Status: DC

## 2016-01-31 NOTE — Progress Notes (Signed)
Encounter reviewed Jill Jertson, MD   

## 2016-01-31 NOTE — Progress Notes (Signed)
65 y.o. G0P0000 Single  Caucasian Fe here for annual exam. Menopausal no vaginal bleeding or vaginal dryness. Sees PCP yearly for aex/labs and medication management. No HSV outbreaks in past year. Has new partner and very happy. No STD screening desired. Patient had breast MRI this year as recommended. All normal, relieved it was done. No health issues today. "Happy to be alive".   Patient's last menstrual period was 05/04/1981.          Sexually active: Yes.    The current method of family planning is status post hysterectomy.    Exercising: Yes.    strength training & cardio Smoker:  no  Health Maintenance: Pap:  01-30-15 neg MMG:  12-30-15 MRI birads 1:neg, 7/17 category d density birads 1:neg Colonoscopy:  2015 neg BMD:   2017 TDaP:  2010 Shingles: no Pneumonia: no Hep C and HIV: yrs ago Labs: pcp Self breast exam: not done   reports that she has quit smoking. She has never used smokeless tobacco. She reports that she drinks about 3.0 - 6.0 oz of alcohol per week . She reports that she does not use drugs.  Past Medical History:  Diagnosis Date  . Abnormal Pap smear    sever dysplasia  . Dense breast    BSGI   . Diverticulitis   . Hypertension   . STD (sexually transmitted disease)    HSV2    Past Surgical History:  Procedure Laterality Date  . APPENDECTOMY    . CHOLECYSTECTOMY    . COLPOSCOPY  9/11   atypia only  . TEMPOROMANDIBULAR JOINT SURGERY     Right  . TONSILLECTOMY    . VAGINAL HYSTERECTOMY  1983    Current Outpatient Prescriptions  Medication Sig Dispense Refill  . Cholecalciferol (VITAMIN D PO) Take by mouth daily.    Marland Kitchen telmisartan (MICARDIS) 40 MG tablet Take 40 mg by mouth daily.    . valACYclovir (VALTREX) 1000 MG tablet TAKE 1 TABLET BY MOUTH TWICE DAILY FOR 3 DAYS AT ONSET OF OUTBREAK 30 tablet 6  . EPINEPHrine (EPIPEN) 0.3 mg/0.3 mL DEVI Inject 0.3 mLs (0.3 mg total) into the muscle as needed. (Patient not taking: Reported on 01/31/2016) 1 Device 3    No current facility-administered medications for this visit.     Family History  Problem Relation Age of Onset  . Hypertension Mother   . Lung disease Mother   . Heart disease Mother   . Breast cancer Mother   . Osteoporosis Mother   . Hypertension Father   . Heart disease Father   . Hypertension Brother   . Cancer Brother     esophageal  . Breast cancer Maternal Aunt   . Hypertension Brother     ROS:  Pertinent items are noted in HPI.  Otherwise, a comprehensive ROS was negative.  Exam:   BP 102/70   Pulse 70   Resp 16   Ht 5\' 6"  (1.676 m)   Wt 147 lb (66.7 kg)   LMP 05/04/1981   BMI 23.73 kg/m  Height: 5\' 6"  (167.6 cm) Ht Readings from Last 3 Encounters:  01/31/16 5\' 6"  (1.676 m)  01/30/15 5' 6.5" (1.689 m)  01/24/14 5' 6.5" (1.689 m)    General appearance: alert, cooperative and appears stated age Head: Normocephalic, without obvious abnormality, atraumatic Neck: no adenopathy, supple, symmetrical, trachea midline and thyroid normal to inspection and palpation Lungs: clear to auscultation bilaterally Breasts: normal appearance, no masses or tenderness, No nipple retraction or dimpling,  No nipple discharge or bleeding, No axillary or supraclavicular adenopathy Heart: regular rate and rhythm Abdomen: soft, non-tender; no masses,  no organomegaly Extremities: extremities normal, atraumatic, no cyanosis or edema Skin: Skin color, texture, turgor normal. No rashes or lesions Lymph nodes: Cervical, supraclavicular, and axillary nodes normal. No abnormal inguinal nodes palpated Neurologic: Grossly normal   Pelvic: External genitalia:  no lesions              Urethra:  normal appearing urethra with no masses, tenderness or lesions              Bartholin's and Skene's: normal                 Vagina: normal appearing vagina with normal color and discharge, no lesions              Cervix: absent              Pap taken: Yes.   Bimanual Exam:  Uterus:  uterus  absent              Adnexa: normal adnexa and no mass, fullness, tenderness               Rectovaginal: Confirms               Anus:  normal sphincter tone, no lesions  Chaperone present: yes  A:  Well Woman with normal exam  Menopausal no HRT. S/P TVH for cervical atypia, ovaries retained  DES exposure  HSV history no outbreaks, but desires Valtrex to have on hand if occurs  Vaginal dryness resolved with coconut oil use  P:   Reviewed health and wellness pertinent to exam  Aware of recommendation with pap smear recommendations  Rx Valtrex see order  Pap smear as above   counseled on breast self exam, mammography screening, adequate intake of calcium and vitamin D, diet and exercise, Kegel's exercises   RV prn, aex

## 2016-01-31 NOTE — Patient Instructions (Signed)

## 2016-02-07 ENCOUNTER — Other Ambulatory Visit: Payer: Self-pay | Admitting: Certified Nurse Midwife

## 2016-02-07 DIAGNOSIS — R87622 Low grade squamous intraepithelial lesion on cytologic smear of vagina (LGSIL): Secondary | ICD-10-CM

## 2016-02-07 LAB — IPS PAP SMEAR ONLY

## 2016-02-13 ENCOUNTER — Telehealth: Payer: Self-pay

## 2016-02-13 NOTE — Telephone Encounter (Signed)
Spoke with patient. Advised of results as seen below from [place]. Patient is agreeable and verbalizes understanding. Colposcopy appointment scheduled for 02/25/2016 at 2 pm with Melvia Heaps CNM. Patient is agreeable to date and time.   Instructions given. Make sure to eat a meal before appointment and drink plenty of fluids.Patient agreeable and verbalized understanding of all instructions.   Routing to provider for final review. Patient agreeable to disposition. Will close encounter.

## 2016-02-13 NOTE — Telephone Encounter (Signed)
Left message to call Azha Constantin at [phone]. 

## 2016-02-13 NOTE — Telephone Encounter (Signed)
-----   Message from Regina Eck, CNM sent at 02/07/2016  2:48 PM EDT ----- Notify patient that pap smear showed LSIL and will need colposcopy exam please schedule  Order in

## 2016-02-25 ENCOUNTER — Encounter: Payer: Self-pay | Admitting: Certified Nurse Midwife

## 2016-02-25 ENCOUNTER — Ambulatory Visit (INDEPENDENT_AMBULATORY_CARE_PROVIDER_SITE_OTHER): Payer: Medicare Other | Admitting: Certified Nurse Midwife

## 2016-02-25 DIAGNOSIS — R87622 Low grade squamous intraepithelial lesion on cytologic smear of vagina (LGSIL): Secondary | ICD-10-CM | POA: Diagnosis not present

## 2016-02-25 NOTE — Progress Notes (Signed)
Patient ID: Ruth Campos, female   DOB: January 05, 1951, 65 y.o.   MRN: [id number]  Chief Complaint  Patient presents with  . Colposcopy    //jj    HPI Ruth Campos is a 65 y.o. g0p0 white widow  female here for colposcopy exam. Denies vaginal bleeding or pelvic pain.  New partner. HPI  Indications: Pap smear on 01/31/16 showed: low-grade squamous intraepithelial neoplasia (LGSIL - encompassing HPV,mild dysplasia,CIN I). Previous colposcopy: years ago prior to TAH for dysplasia and being a DES exposure patient. Prior cervical treatment: hysterectomy.  Past Medical History:  Diagnosis Date  . Abnormal Pap smear    sever dysplasia, 01-31-16 LGSIL  . Dense breast    BSGI   . Diverticulitis   . Hypertension   . STD (sexually transmitted disease)    HSV2    Past Surgical History:  Procedure Laterality Date  . APPENDECTOMY    . CHOLECYSTECTOMY    . COLPOSCOPY  9/11   atypia only  . TEMPOROMANDIBULAR JOINT SURGERY     Right  . TONSILLECTOMY    . VAGINAL HYSTERECTOMY  1983    Family History  Problem Relation Age of Onset  . Hypertension Mother   . Lung disease Mother   . Heart disease Mother   . Breast cancer Mother   . Osteoporosis Mother   . Hypertension Father   . Heart disease Father   . Hypertension Brother   . Cancer Brother     esophageal  . Breast cancer Maternal Aunt   . Hypertension Brother     Social History Social History  Substance Use Topics  . Smoking status: Former Research scientist (life sciences)  . Smokeless tobacco: Never Used  . Alcohol use 3.0 - 6.0 oz/week    5 - 10 Standard drinks or equivalent per week    Allergies  Allergen Reactions  . Bee Venom     Current Outpatient Prescriptions  Medication Sig Dispense Refill  . Cholecalciferol (VITAMIN D PO) Take by mouth daily.    Marland Kitchen EPINEPHrine (EPIPEN) 0.3 mg/0.3 mL DEVI Inject 0.3 mLs (0.3 mg total) into the muscle as needed. 1 Device 3  . telmisartan (MICARDIS) 40 MG tablet Take 40 mg by mouth daily.    . valACYclovir  (VALTREX) 1000 MG tablet TAKE 1 TABLET BY MOUTH TWICE DAILY FOR 3 DAYS AT ONSET OF OUTBREAK 30 tablet 6   No current facility-administered medications for this visit.     Review of Systems Review of Systems  Constitutional: Negative.   Genitourinary: Negative for pelvic pain, vaginal bleeding, vaginal discharge and vaginal pain.    Blood pressure 106/64, pulse 68, temperature 97.9 F (36.6 C), temperature source Oral, resp. rate 16, height 5\' 6"  (1.676 m), weight 149 lb (67.6 kg), last menstrual period 05/04/1981.  Physical Exam Physical Exam  Constitutional: She is oriented to person, place, and time. She appears well-developed and well-nourished.  Genitourinary: Vagina normal. There is no rash, tenderness or lesion on the right labia. There is no rash, tenderness or lesion on the left labia.    Neurological: She is alert and oriented to person, place, and time.  Psychiatric: She has a normal mood and affect. Her behavior is normal. Judgment and thought content normal.    Data Reviewed Reviewed pap smear results and questions answered.  Assessment    Procedure Details  The risks and benefits of the procedure and Written informed consent obtained.  Speculum placed in vagina and excellent visualization of vagina achieved,vagina swabbed x 3 with  acetic acid solution . Vagina examined in all quadrants and small acetowhite area noted at 9 o'clock at posterior area of vagina and on left at 3 o'clock of vaginal wall. Lugol's applied with light staining noted in these areas. Biopsies taken at 9 and 3 o'clock. Monsel's applied. No active bleeding noted on removal of speculum. Patient tolerated procedure well. Instructions given.  Specimens: 2  Complications: none.     Plan    Specimens labelled and sent to Pathology. Patient will be notified when pathology reviewed.. 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 Patient will be  notified and pap recall 08 placed.      [name] 02/25/2016, 2:28 PM

## 2016-02-25 NOTE — Patient Instructions (Signed)

## 2016-02-25 NOTE — Progress Notes (Signed)
Hx of abnormal pap smear with severe dysplasia 01-31-16 LGSIL Pt did not take ibuprofen.

## 2016-02-27 LAB — IPS OTHER TISSUE BIOPSY

## 2016-02-28 ENCOUNTER — Telehealth: Payer: Self-pay

## 2016-02-28 NOTE — Telephone Encounter (Signed)
Spoke with patient. Advised of results as seen below from Ruth Campos. Patient is agreeable and verbalizes understanding. Next aex 02/02/2017. 08 recall placed.  Routing to provider for final review. Patient agreeable to disposition. Will close encounter.

## 2016-02-28 NOTE — Progress Notes (Signed)
Encounter reviewed Ruth Gains, MD   

## 2016-02-28 NOTE — Telephone Encounter (Signed)
-----   Message from Regina Eck, CNM sent at 02/28/2016 10:32 AM EDT ----- Notify patient that her biopsy showed LSIL, VAIN 1 on both, which correlates well with her pap results. Need follow up pap in one year 08 recall

## 2017-02-02 ENCOUNTER — Encounter: Payer: Self-pay | Admitting: Certified Nurse Midwife

## 2017-02-02 ENCOUNTER — Ambulatory Visit (INDEPENDENT_AMBULATORY_CARE_PROVIDER_SITE_OTHER): Payer: Medicare Other | Admitting: Certified Nurse Midwife

## 2017-02-02 ENCOUNTER — Other Ambulatory Visit (HOSPITAL_COMMUNITY)
Admission: RE | Admit: 2017-02-02 | Discharge: 2017-02-02 | Disposition: A | Discharge status: Home / Self Care | Payer: Medicare Other | Source: Ambulatory Visit | Attending: Obstetrics & Gynecology | Admitting: Obstetrics & Gynecology

## 2017-02-02 VITALS — BP 110/60 | HR 64 | Resp 16 | Ht 66.25 in | Wt 149.0 lb

## 2017-02-02 DIAGNOSIS — A6000 Herpesviral infection of urogenital system, unspecified: Secondary | ICD-10-CM

## 2017-02-02 DIAGNOSIS — Z01419 Encounter for gynecological examination (general) (routine) without abnormal findings: Secondary | ICD-10-CM | POA: Diagnosis not present

## 2017-02-02 DIAGNOSIS — Z124 Encounter for screening for malignant neoplasm of cervix: Secondary | ICD-10-CM | POA: Diagnosis not present

## 2017-02-02 MED ORDER — VALACYCLOVIR HCL 1 G PO TABS: ORAL_TABLET | ORAL | 6 refills | Status: DC

## 2017-02-02 NOTE — Patient Instructions (Signed)

## 2017-02-02 NOTE — Progress Notes (Signed)
66 y.o. G0P0000 Widowed  Caucasian Fe here for annual exam. Sees Dr. Shelia Media for aex/labs. Has moved to [place] now and sold her [place]. Recent death of brother to drowning while scuba diving( he loved) age 53. Emotionally doing well. No health issues today.Would pap smear even though has had hysterectomy. Has Mammogram scheduled. Has met the a great man!! Now sexually active no issues with vaginal dryness or bleeding.  Patient's last menstrual period was 05/04/1981.          Sexually active: Yes.    The current method of family planning is status post hysterectomy.    Exercising: Yes.    weights Smoker:  no  Health Maintenance: Pap:  01/31/16 CIN1, Colpo: LSIL, VAIN1  01/30/15 Negative  History of Abnormal Pap: yes, DES exposure  MMG:  12/30/15 MRI BIRADS1:neg. F/u 1 year  Self Breast exams: yes, occ Colonoscopy:  09/2016 f/u 5 years  BMD:   2017 Low bone mass.  TDaP:  2010  Shingles: No Pneumonia: Done Hep C and HIV: done  Labs: PCP   reports that she has quit smoking. She has never used smokeless tobacco. She reports that she drinks about 3.0 - 6.0 oz of alcohol per week . She reports that she does not use drugs.  Past Medical History:  Diagnosis Date  . Abnormal Pap smear    sever dysplasia, 01-31-16 LGSIL  . Dense breast    BSGI   . Diverticulitis   . Hypertension   . STD (sexually transmitted disease)    HSV2    Past Surgical History:  Procedure Laterality Date  . APPENDECTOMY    . CHOLECYSTECTOMY    . COLPOSCOPY  9/11   atypia only  . TEMPOROMANDIBULAR JOINT SURGERY     Right  . TONSILLECTOMY    . VAGINAL HYSTERECTOMY  1983    Current Outpatient Prescriptions  Medication Sig Dispense Refill  . Cholecalciferol (VITAMIN D PO) Take by mouth daily.    Marland Kitchen EPINEPHrine (EPIPEN) 0.3 mg/0.3 mL DEVI Inject 0.3 mLs (0.3 mg total) into the muscle as needed. 1 Device 3  . telmisartan (MICARDIS) 40 MG tablet Take 40 mg by mouth daily.    . valACYclovir (VALTREX) 1000 MG tablet  TAKE 1 TABLET BY MOUTH TWICE DAILY FOR 3 DAYS AT ONSET OF OUTBREAK 30 tablet 6   No current facility-administered medications for this visit.     Family History  Problem Relation Age of Onset  . Hypertension Mother   . Lung disease Mother   . Heart disease Mother   . Breast cancer Mother   . Osteoporosis Mother   . Hypertension Father   . Heart disease Father   . Hypertension Brother   . Cancer Brother        esophageal  . Breast cancer Maternal Aunt   . Hypertension Brother     ROS:  Pertinent items are noted in HPI.  Otherwise, a comprehensive ROS was negative.  Exam:   BP 110/60 (BP Location: Right Arm, Patient Position: Sitting, Cuff Size: Normal)   Pulse 64   Resp 16   Ht 5' 6.25" (1.683 m)   Wt 149 lb (67.6 kg)   LMP 05/04/1981   BMI 23.87 kg/m  Height: 5' 6.25" (168.3 cm) Ht Readings from Last 3 Encounters:  02/02/17 5' 6.25" (1.683 m)  02/25/16 5' 6"  (1.676 m)  01/31/16 5' 6"  (1.676 m)    General appearance: alert, cooperative and appears stated age Head: Normocephalic, without obvious abnormality,  atraumatic Neck: no adenopathy, supple, symmetrical, trachea midline and thyroid normal to inspection and palpation Lungs: clear to auscultation bilaterally Breasts: normal appearance, no masses or tenderness, No nipple retraction or dimpling, No nipple discharge or bleeding, No axillary or supraclavicular adenopathy Heart: regular rate and rhythm Abdomen: soft, non-tender; no masses,  no organomegaly Extremities: extremities normal, atraumatic, no cyanosis or edema Skin: Skin color, texture, turgor normal. No rashes or lesions Lymph nodes: Cervical, supraclavicular, and axillary nodes normal. No abnormal inguinal nodes palpated Neurologic: Grossly normal   Pelvic: External genitalia:  no lesions              Urethra:  normal appearing urethra with no masses, tenderness or lesions              Bartholin's and Skene's: normal                 Vagina: normal  appearing vagina with normal color and discharge, no lesions              Cervix: absent              Pap taken: Yes.   Bimanual Exam:  Uterus:  uterus absent              Adnexa: normal adnexa and no mass, fullness, tenderness               Rectovaginal: Confirms               Anus:  normal sphincter tone, no lesions  Chaperone present: yes  A:  Well Woman with normal exam  Menopausal no HRT  History of atypia with pap years ago, had TVH requests pap smear  Hypertension with PCP management  History of HSV 2 needs refill  P:   Reviewed health and wellness pertinent to exam  Aware of need to advise if vaginal bleeding  Continue follow up with PCP as indicated  Rx Valtrex see order with instructions  Pap smear: yes   counseled on mammography screening, feminine hygiene, adequate intake of calcium and vitamin D, diet and exercise  return annually or prn  An After Visit Summary was printed and given to the patient.

## 2017-02-04 LAB — CYTOLOGY - PAP: Diagnosis: NEGATIVE

## 2017-07-16 IMAGING — MR MR BREAST BILAT WO/W CM
8 of 12 series · 34 of 48 positions shown · IV contrast (multihance)
Comparison: Mammography November 2015

CLINICAL DATA: Family history of breast cancer.  Dense breasts.

LABS:  Creatinine 0.8.  GFR 79.
EXAM:
BILATERAL BREAST MRI WITH AND WITHOUT CONTRAST
TECHNIQUE: Multiplanar, multisequence MR images of both breasts were obtained
prior to and following the intravenous administration of 13 ml of
MultiHance.

[Series 2: t2_tirm_tra ipat (a-p) · axial · 3.0mm · 0.70mm/px · 1 of 55 slices shown]
[im 1/55]
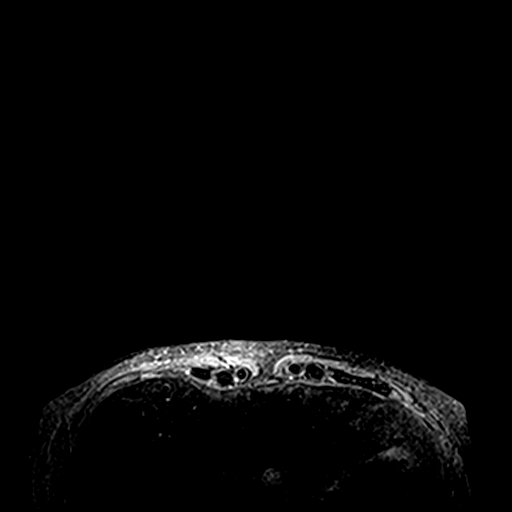

[Series 3: fl3d pre-cm no · axial · non-contrast · 1.2mm · 0.94mm/px · z∈[-88,+83]mm · 5 of 144 slices shown]
[im 1/144]
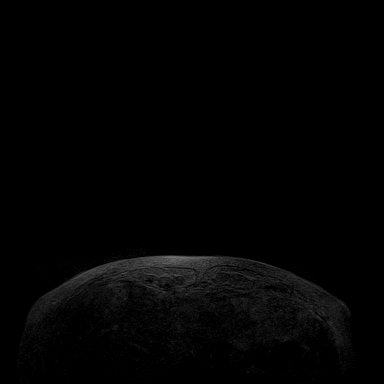
[im 36/144]
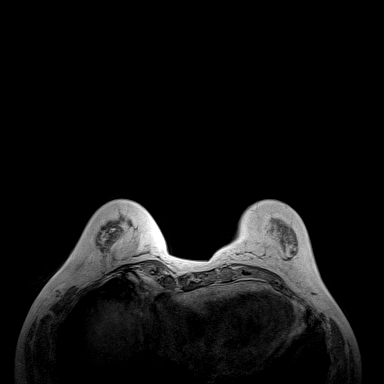
[im 72/144]
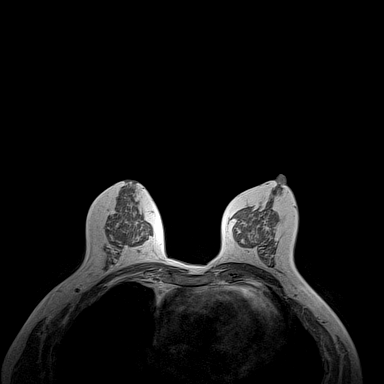
[im 108/144]
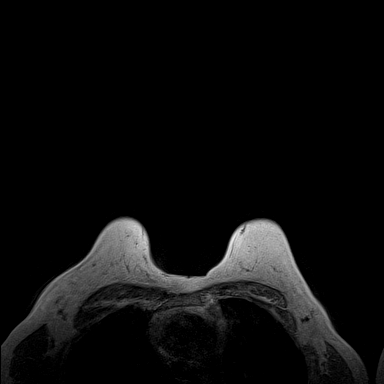
[im 144/144]
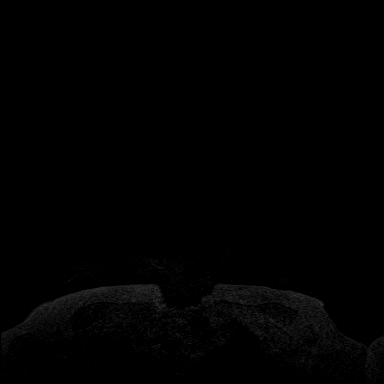

[Series 4: fl3d pre-cm · axial · non-contrast · 1.2mm · 0.94mm/px · z∈[-88,+83]mm · 5 of 144 slices shown]
[im 1/144]
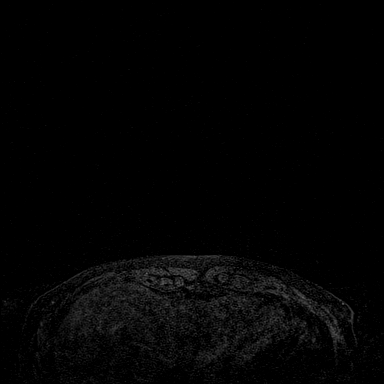
[im 36/144]
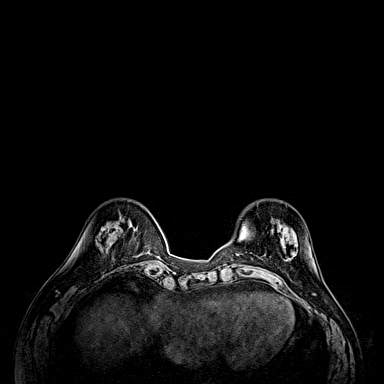
[im 72/144]
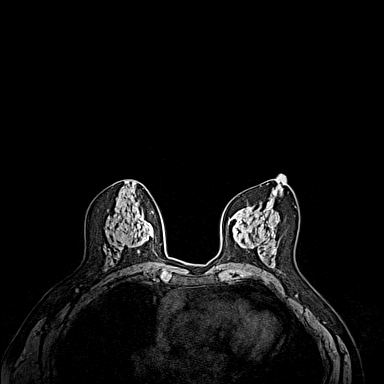
[im 108/144]
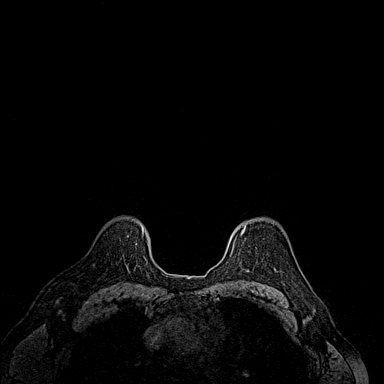
[im 144/144]
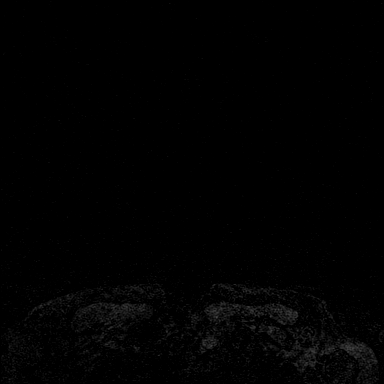

[Series 5: fl3d post immediate · axial · 1.2mm · 0.94mm/px · z∈[-88,+83]mm · 5 of 144 slices shown (1 of 3)]
[im 1/144]
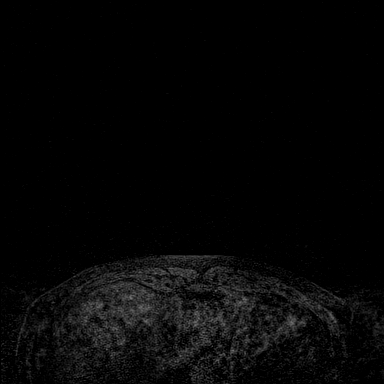
[im 36/144]
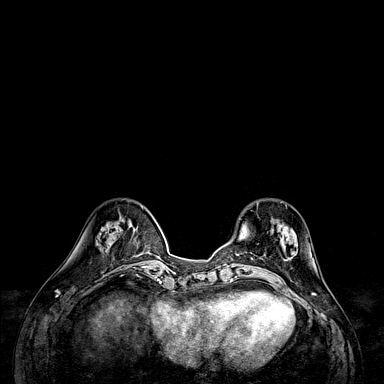
[im 72/144]
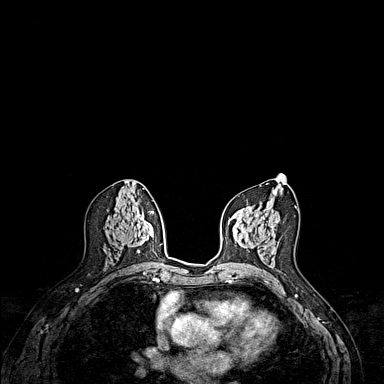
[im 108/144]
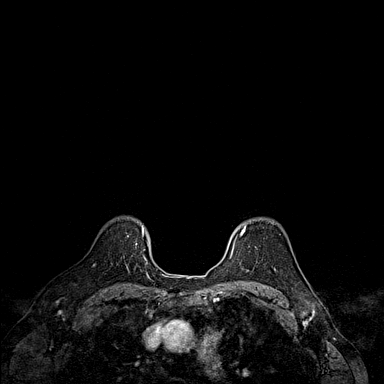
[im 144/144]
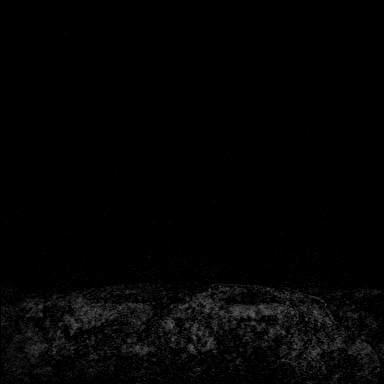

[Series 6: fl3d post immediate · axial · 1.2mm · 0.94mm/px · z∈[-88,+83]mm · 5 of 144 slices shown (2 of 3)]
[im 1/144]
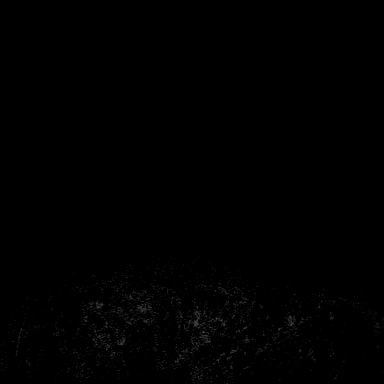
[im 36/144]
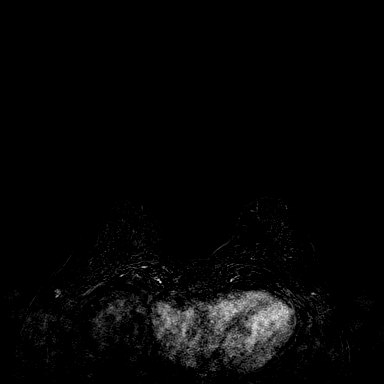
[im 72/144]
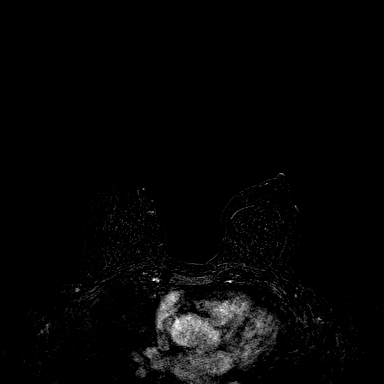
[im 108/144]
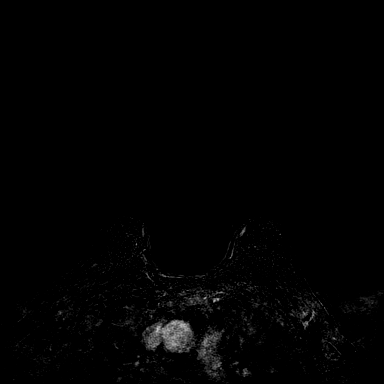
[im 144/144]
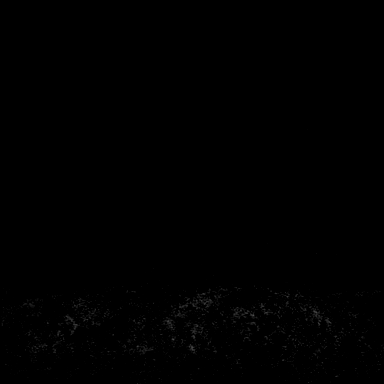

[Series 7: fl3d post immediate · axial · 172.8mm · 0.94mm/px · 1 of 1 slices shown (3 of 3)]
[im 1/1]
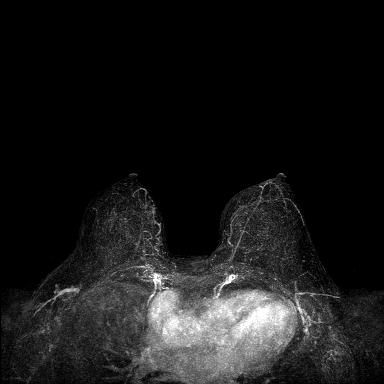

[Series 8: fl3d post 3min · axial · 1.2mm · 0.94mm/px · z∈[-88,+83]mm · 6 of 144 slices shown]
[im 1/144]
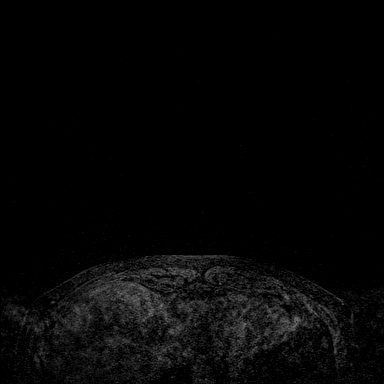
[im 29/144]
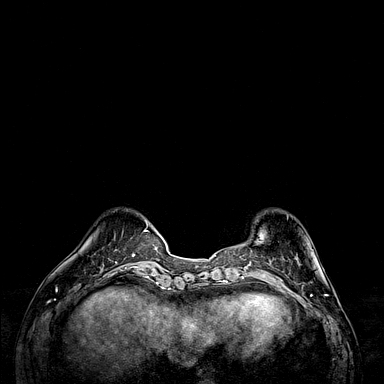
[im 58/144]
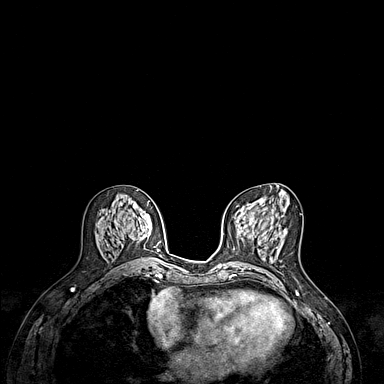
[im 86/144]
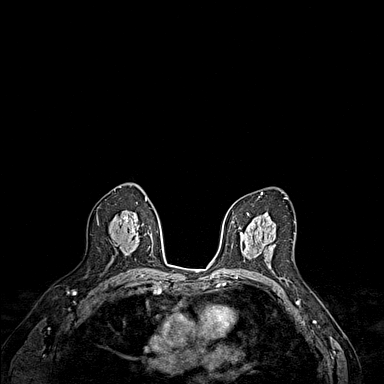
[im 115/144]
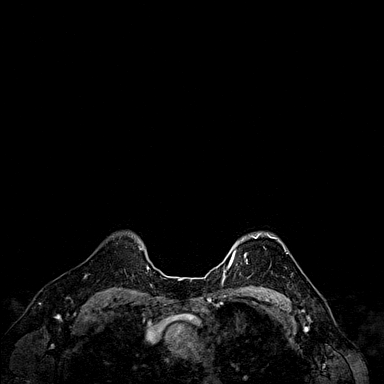
[im 144/144]
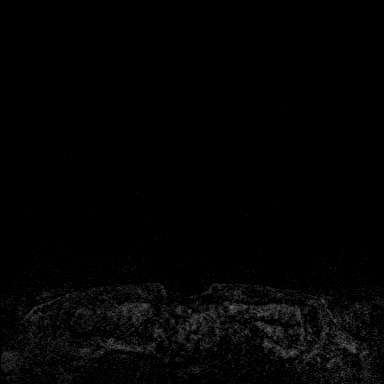

[Series 9: fl3d post 3min_sub · axial · 1.2mm · 0.94mm/px · z∈[-88,+83]mm · 6 of 144 slices shown]
[im 1/144]
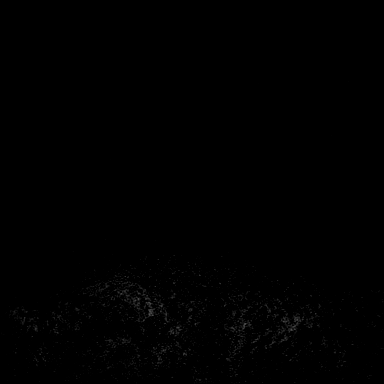
[im 29/144]
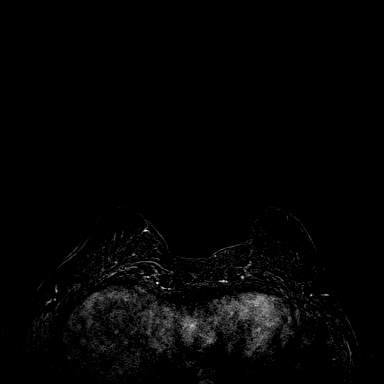
[im 58/144]
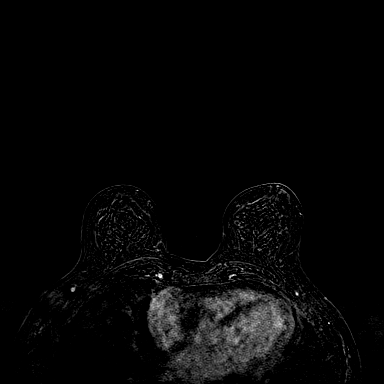
[im 86/144]
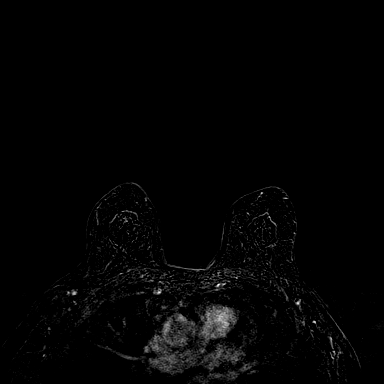
[im 115/144]
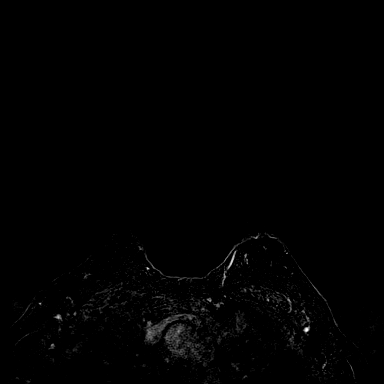
[im 144/144]
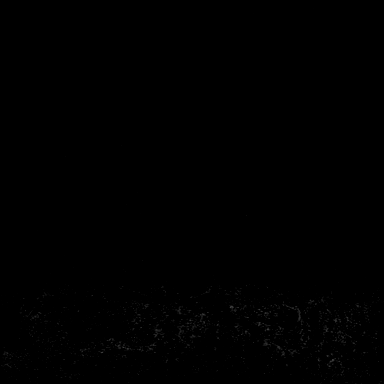

[34 of 48 positions shown; findings below may reference images not displayed]

THREE-DIMENSIONAL MR IMAGE RENDERING ON INDEPENDENT WORKSTATION:

Three-dimensional MR images were rendered by post-processing of the
original MR data on an independent workstation. The
three-dimensional MR images were interpreted, and findings are
reported in the following complete MRI report for this study. Three
dimensional images were evaluated at the independent [employer]
workstation
FINDINGS: Breast composition: The breasts are composed of heterogeneously
dense tissue

Background parenchymal enhancement: Moderate.

Right breast: No mass or abnormal enhancement.

Left breast: No mass or abnormal enhancement.

Lymph nodes: No abnormal appearing lymph nodes.

Ancillary findings:  None.
IMPRESSION: No MRI evidence of malignancy

RECOMMENDATION:
Annual screening mammography

BI-RADS CATEGORY  1: Negative.

## 2018-03-08 ENCOUNTER — Ambulatory Visit (INDEPENDENT_AMBULATORY_CARE_PROVIDER_SITE_OTHER): Payer: Medicare Other | Admitting: Certified Nurse Midwife

## 2018-03-08 ENCOUNTER — Encounter: Payer: Self-pay | Admitting: Certified Nurse Midwife

## 2018-03-08 ENCOUNTER — Other Ambulatory Visit: Payer: Self-pay

## 2018-03-08 VITALS — BP 122/78 | HR 70 | Resp 16 | Ht 66.5 in | Wt 159.0 lb

## 2018-03-08 DIAGNOSIS — Z01419 Encounter for gynecological examination (general) (routine) without abnormal findings: Secondary | ICD-10-CM

## 2018-03-08 DIAGNOSIS — A6000 Herpesviral infection of urogenital system, unspecified: Secondary | ICD-10-CM | POA: Diagnosis not present

## 2018-03-08 DIAGNOSIS — Z853 Personal history of malignant neoplasm of breast: Secondary | ICD-10-CM

## 2018-03-08 DIAGNOSIS — Z124 Encounter for screening for malignant neoplasm of cervix: Secondary | ICD-10-CM

## 2018-03-08 DIAGNOSIS — I10 Essential (primary) hypertension: Secondary | ICD-10-CM | POA: Insufficient documentation

## 2018-03-08 MED ORDER — VALACYCLOVIR HCL 1 G PO TABS: ORAL_TABLET | ORAL | 12 refills | Status: AC

## 2018-03-08 NOTE — Progress Notes (Signed)
67 y.o. G0P0000 Widowed  Caucasian Fe here for annual exam. Patient was diagnosed with breast cancer 05/22/17,with lumpectomy and diagnosed DCIS and treated with radiation and now on Tamoxifen.Genetic studies negative. Has now moved to [facility] from [place] since "Rob" retired. So glad she had MRI's yearly. Has not noted any HSV outbreaks, would like written RX. Denies vaginal bleeding or vaginal dryness. Continues follow up with breast cancer in [place] where surgery done. Sees PCP there for aex and labs and Micardis medication. No other health issues today  Patient's last menstrual period was 05/04/1981.          Sexually active: Yes.    The current method of family planning is status post hysterectomy.    Exercising: Yes.    cardio & weights Smoker:  no  Review of Systems  Constitutional: Negative.   HENT: Negative.   Eyes: Negative.   Respiratory: Negative.   Cardiovascular: Negative.   Gastrointestinal: Negative.   Genitourinary: Negative.   Musculoskeletal: Negative.   Skin: Negative.   Neurological: Negative.   Endo/Heme/Allergies: Negative.   Psychiatric/Behavioral: Negative.     Health Maintenance: Pap:  01-31-16 CIN1 colpo: LSIL,VAIN1, 02-02-17 neg History of Abnormal Pap: yes, DES exposure MMG:  Rt Breast cancer 2019, see mmg reports Self Breast exams: no Colonoscopy:  2018 f/u 83yrs BMD:   2018 TDaP:  2011 Shingles: no Pneumonia: had done Hep C and HIV: neg per patient Labs:yes   reports that she has quit smoking. She has never used smokeless tobacco. She reports that she drinks about 5.0 - 10.0 standard drinks of alcohol per week. She reports that she does not use drugs.  Past Medical History:  Diagnosis Date  . Abnormal Pap smear    sever dysplasia, 01-31-16 LGSIL  . Dense breast    BSGI   . Diverticulitis   . Hypertension   . STD (sexually transmitted disease)    HSV2    Past Surgical History:  Procedure Laterality Date  . APPENDECTOMY    .  CHOLECYSTECTOMY    . COLPOSCOPY  9/11   atypia only  . TEMPOROMANDIBULAR JOINT SURGERY     Right  . TONSILLECTOMY    . VAGINAL HYSTERECTOMY  1983    Current Outpatient Medications  Medication Sig Dispense Refill  . Cholecalciferol (VITAMIN D PO) Take by mouth daily.    Marland Kitchen EPINEPHrine (EPIPEN) 0.3 mg/0.3 mL DEVI Inject 0.3 mLs (0.3 mg total) into the muscle as needed. 1 Device 3  . telmisartan (MICARDIS) 40 MG tablet Take 40 mg by mouth daily.    . valACYclovir (VALTREX) 1000 MG tablet TAKE 1 TABLET BY MOUTH TWICE DAILY FOR 3 DAYS AT ONSET OF OUTBREAK 30 tablet 6   No current facility-administered medications for this visit.     Family History  Problem Relation Age of Onset  . Hypertension Mother   . Lung disease Mother   . Heart disease Mother   . Breast cancer Mother   . Osteoporosis Mother   . Hypertension Father   . Heart disease Father   . Hypertension Brother   . Cancer Brother        esophageal  . Breast cancer Maternal Aunt   . Hypertension Brother     ROS:  Pertinent items are noted in HPI.  Otherwise, a comprehensive ROS was negative.  Exam:   LMP 05/04/1981    Ht Readings from Last 3 Encounters:  02/02/17 5' 6.25" (1.683 m)  02/25/16 5\' 6"  (1.676 m)  01/31/16 5\' 6"  (1.676 m)    General appearance: alert, cooperative and appears stated age Head: Normocephalic, without obvious abnormality, atraumatic Neck: no adenopathy, supple, symmetrical, trachea midline and thyroid normal to inspection and palpation Lungs: clear to auscultation bilaterally Breasts: normal appearance, no masses or tenderness, No nipple retraction or dimpling, No nipple discharge or bleeding, No axillary or supraclavicular adenopathy, right breast scar from breast cancer surgery Heart: regular rate and rhythm Abdomen: soft, non-tender; no masses,  no organomegaly Extremities: extremities normal, atraumatic, no cyanosis or edema Skin: Skin color, texture, turgor normal. No rashes or  lesions Lymph nodes: Cervical, supraclavicular, and axillary nodes normal. No abnormal inguinal nodes palpated Neurologic: Grossly normal   Pelvic: External genitalia:  no lesions              Urethra:  normal appearing urethra with no masses, tenderness or lesions              Bartholin's and Skene's: normal                 Vagina: normal appearing vagina with normal color and discharge, no lesions              Cervix: absent              Pap taken: Yes.   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 05/22/17 with radiation and now on Tamoxifen with oncology follow up  History of HSV needs Valtrex update  History of severe cervical dysplasia,  TVH, pap yearly  P:   Reviewed health and wellness pertinent to exam  Aware of need to advise if vaginal bleeding or vaginal dryness changes  Continue follow up with Oncology as indicated  Rx Valtrex see order with instructions  Discussed importance of pap smear yearly if transfers care to [place]  Pap smear: yes   counseled on breast self exam, mammography screening, feminine hygiene, adequate intake of calcium and vitamin D, diet and exercise  return annually or prn  An After Visit Summary was printed and given to the patient.

## 2018-03-10 LAB — CYTOLOGY - PAP
Diagnosis: NEGATIVE
HPV: NOT DETECTED

## 2019-07-26 ENCOUNTER — Encounter: Payer: Self-pay | Admitting: Certified Nurse Midwife
